# Patient Record
Sex: Male | Born: 1990 | Race: White | Hispanic: No | Marital: Single | Smoking: Current every day smoker
Health system: Southern US, Community
[De-identification: ages and names within clinical notes are randomized; demographics above are authoritative.]

## PROBLEM LIST (undated history)

## (undated) HISTORY — PX: OTHER SURGICAL HISTORY: SHX169

---
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
## 2011-05-26 ENCOUNTER — Emergency Department: Payer: Self-pay | Admitting: Emergency Medicine

## 2011-06-14 ENCOUNTER — Emergency Department: Payer: Self-pay | Admitting: Emergency Medicine

## 2013-03-06 IMAGING — CR DG KNEE COMPLETE 4+V*R*
1 series · 4 of 4 positions shown · non-contrast
Comparison: none

REASON FOR EXAM: trauma, pain, swelling, fall
COMMENTS:   LMP: (Male)

PROCEDURE:     DXR - DXR KNEE RT COMP WITH OBLIQUES  - June 14, 2011 [DATE]
RESULT:     Images of the left knee demonstrate no fracture, dislocation or
radiopaque foreign body.

[Series 1: t knee ap right · 0.14mm/px · 4 of 4 slices shown]
[im 1/4]
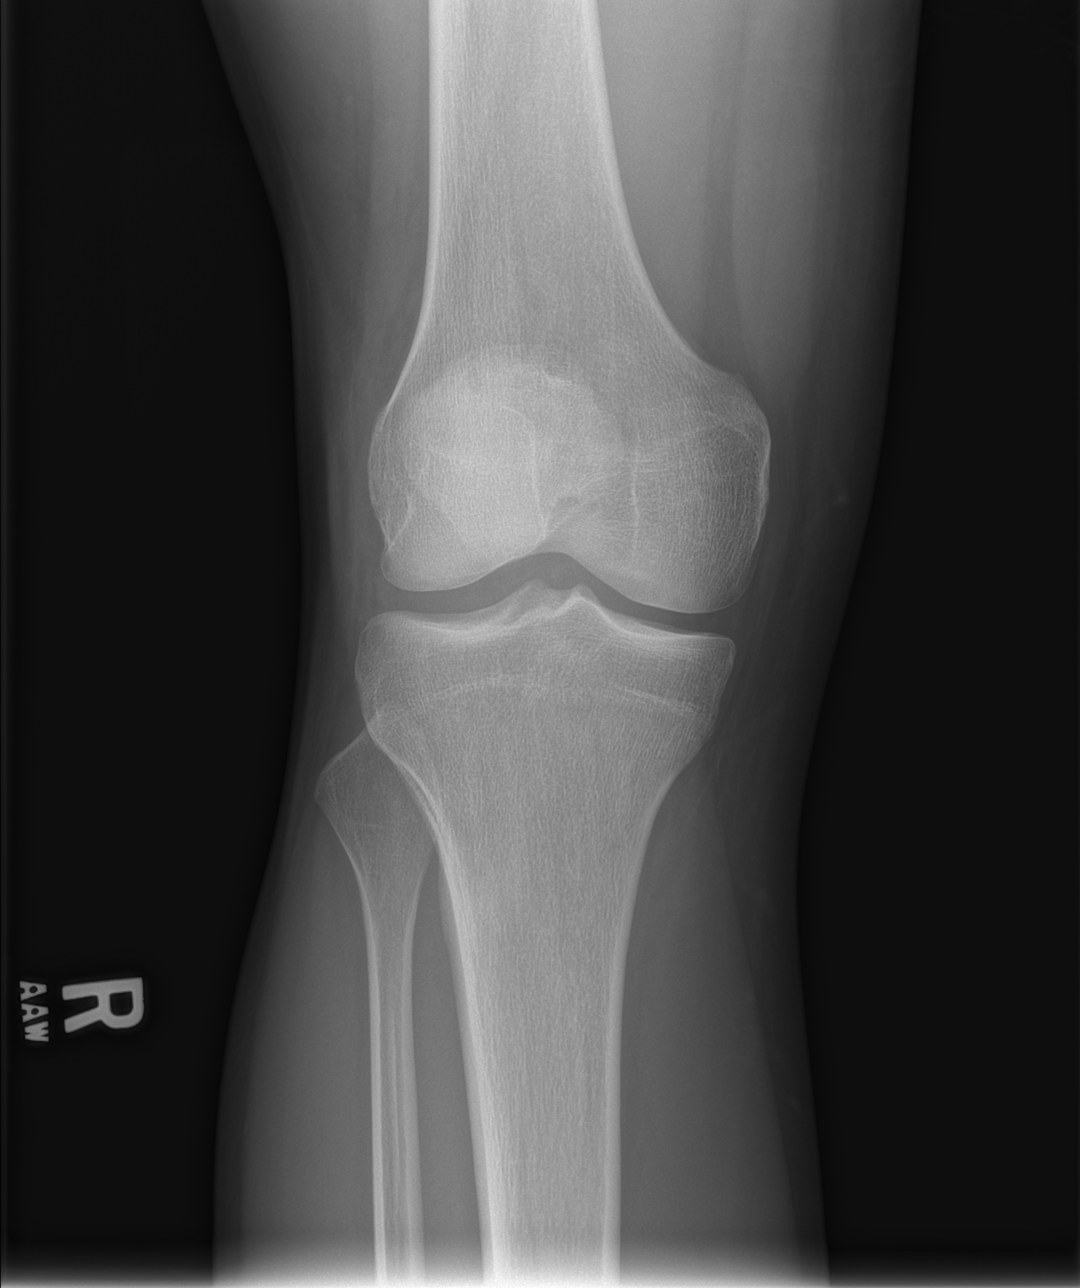
[im 2/4]
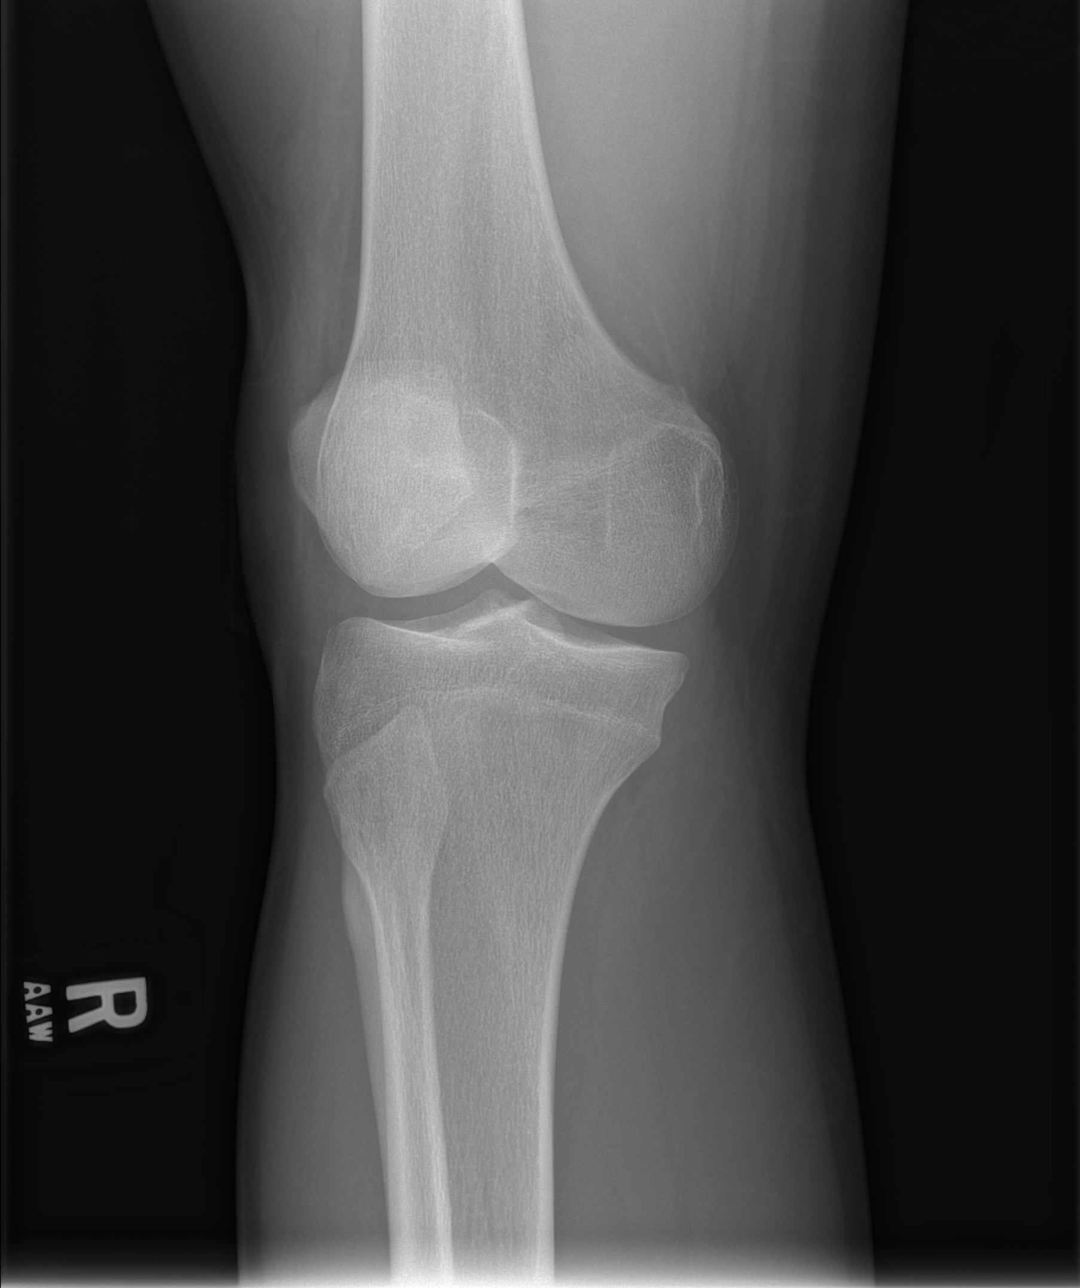
[im 3/4]
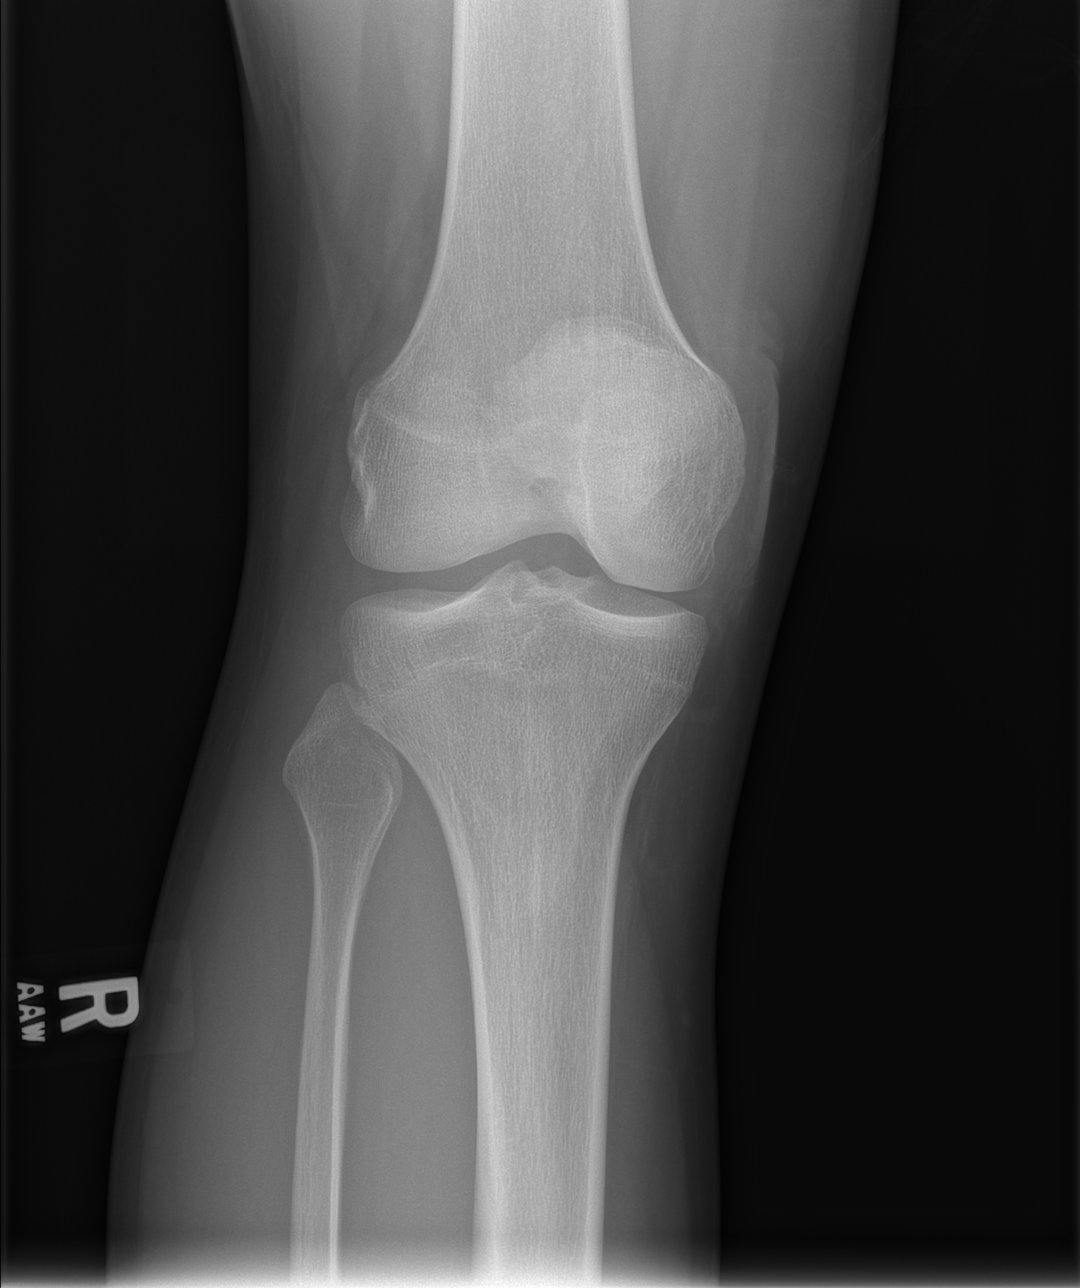
[im 4/4]
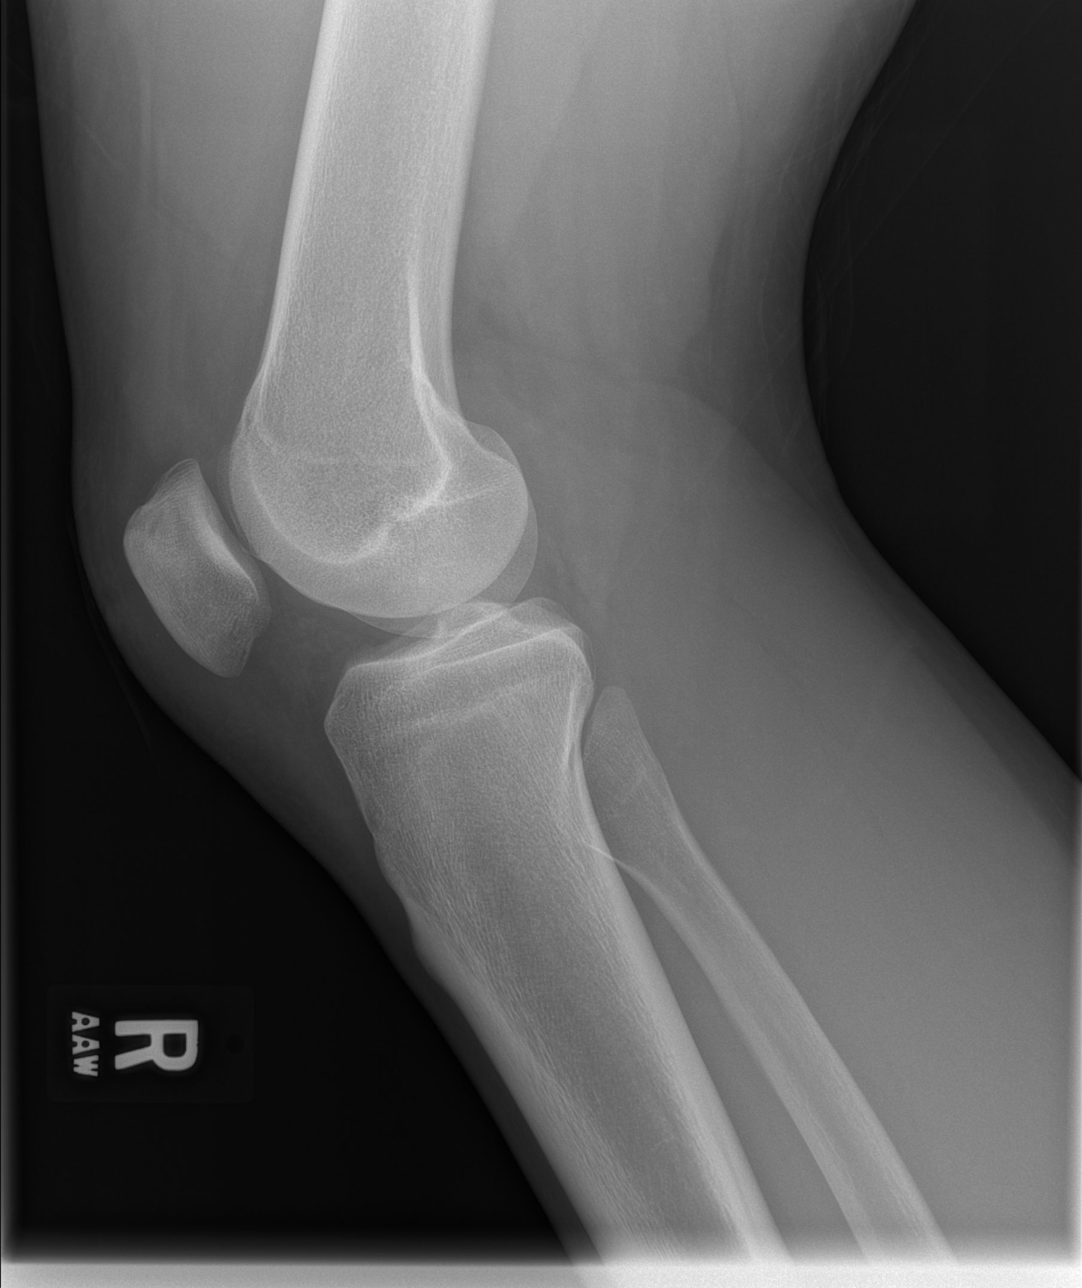

[4 of 4 positions shown; findings below may reference images not displayed]

IMPRESSION: No acute bony abnormality evident.

## 2021-12-29 ENCOUNTER — Ambulatory Visit
Admission: EM | Admit: 2021-12-29 | Discharge: 2021-12-29 | Disposition: A | Discharge status: Home / Self Care | Payer: Self-pay | Attending: Emergency Medicine | Admitting: Emergency Medicine

## 2021-12-29 ENCOUNTER — Encounter: Payer: Self-pay | Admitting: Emergency Medicine

## 2021-12-29 ENCOUNTER — Ambulatory Visit: Payer: Self-pay

## 2021-12-29 DIAGNOSIS — R369 Urethral discharge, unspecified: Secondary | ICD-10-CM | POA: Insufficient documentation

## 2021-12-29 MED ORDER — CEFTRIAXONE SODIUM 500 MG IJ SOLR
500.0000 mg | Freq: Once | INTRAMUSCULAR | Status: AC
  Administered 2021-12-29: 500 mg via INTRAMUSCULAR

## 2021-12-29 NOTE — ED Provider Notes (Signed)
Jeremiah Peterson    CSN: [id number] Arrival date & time: 12/29/21  1622      History   Chief Complaint Chief Complaint  Patient presents with   APPT 1700   Penile Discharge    HPI Jeremiah Peterson is a 31 y.o. male.   Patient presents with green, yellow penile discharge for 2 days, penile head swelling, dysuria and urinary frequency for 3 days. Sexually active, 1 male partner, no condom, use, no known exposure.  Denies hematuria, new rash or lesions,, fever, chills, testicle swelling.  History of syphilis.  History reviewed. No pertinent past medical history.  There are no problems to display for this patient.   Past Surgical History:  Procedure Laterality Date   arm fracture  Left        Home Medications    Prior to Admission medications   Not on File    Family History History reviewed. No pertinent family history.  Social History Social History   Tobacco Use   Smoking status: Every Day    Packs/day: 1.00    Years: 20.00    Total pack years: 20.00    Types: Cigarettes   Smokeless tobacco: Never  Vaping Use   Vaping Use: Every day   Substances: Nicotine  Substance Use Topics   Alcohol use: Not Currently   Drug use: Yes    Types: Marijuana     Allergies   Patient has no known allergies.   Review of Systems Review of Systems  Genitourinary:  Positive for penile discharge and penile swelling. Negative for decreased urine volume, difficulty urinating, dysuria, enuresis, flank pain, frequency, genital sores, hematuria, penile pain, scrotal swelling, testicular pain and urgency.     Physical Exam Triage Vital Signs ED Triage Vitals  Enc Vitals Group     BP 12/29/21 1705 133/78     Pulse Rate 12/29/21 1705 (!) 110     Resp 12/29/21 1705 16     Temp 12/29/21 1705 97.9 F (36.6 C)     Temp Source 12/29/21 1705 Oral     SpO2 12/29/21 1705 98 %     Weight 12/29/21 1708 179 lb (81.2 kg)     Height 12/29/21 1708 5\' 11"  (1.803 m)     Head  Circumference --      Peak Flow --      Pain Score 12/29/21 1708 5     Pain Loc --      Pain Edu? --      Excl. in GC? --    No data found.  Updated Vital Signs BP 133/78 (BP Location: Left Arm)   Pulse (!) 110   Temp 97.9 F (36.6 C) (Oral)   Resp 16   Ht 5\' 11"  (1.803 m)   Wt 179 lb (81.2 kg)   SpO2 98%   BMI 24.97 kg/m   Visual Acuity Right Eye Distance:   Left Eye Distance:   Bilateral Distance:    Right Eye Near:   Left Eye Near:    Bilateral Near:     Physical Exam Constitutional:      Appearance: Normal appearance.  HENT:     Head: Normocephalic.  Eyes:     Extraocular Movements: Extraocular movements intact.  Pulmonary:     Effort: Pulmonary effort is normal.  Genitourinary:    Comments: Moderate penile shaft swelling with erythema surrounding the penile head Neurological:     Mental Status: He is alert and oriented to person, place, and time.  Mental status is at baseline.  Psychiatric:        Mood and Affect: Mood normal.        Behavior: Behavior normal.      UC Treatments / Results  Labs (all labs ordered are listed, but only abnormal results are displayed) Labs Reviewed  CYTOLOGY, (ORAL, ANAL, URETHRAL) ANCILLARY ONLY    EKG   Radiology No results found.  Procedures Procedures (including critical care time)  Medications Ordered in UC Medications  cefTRIAXone (ROCEPHIN) injection 500 mg (has no administration in time range)    Initial Impression / Assessment and Plan / UC Course  I have reviewed the triage vital signs and the nursing notes.  Pertinent labs & imaging results that were available during my care of the patient were reviewed by me and considered in my medical decision making (see chart for details).   Penile discharge  We will prophylactically cover for gonorrhea, Rocephin injection given in office, advised abstinence for 7 days, remaining STI labs pending, will treat further per protocol, advised abstinence until  lab results, treatment is complete and symptoms have resolved, may follow-up with urgent care as needed   Final Clinical Impressions(s) / UC Diagnoses   Final diagnoses:  Penile discharge     Discharge Instructions      Today you are being treated prophylactically for gonorrhea, given an injection of Rocephin having sex for 7 days   Labs pending ,you will be contacted if positive for any sti and treatment will be sent to the pharmacy, you will have to return to the clinic if positive for gonorrhea to receive treatment   Please refrain from having sex until labs results, if positive please refrain from having sex until treatment complete and symptoms resolve   If positive for  Chlamydia  gonorrhea or trichomoniasis please notify partner or partners so they may tested as well  Moving forward, it is recommended you use some form of protection against the transmission of sti infections  such as condoms or dental dams with each sexual encounter     ED Prescriptions   None    PDMP not reviewed this encounter.   Valinda Hoar, NP 12/29/21 1728

## 2021-12-29 NOTE — ED Triage Notes (Signed)
Patient c/o penile discharge x 3 days ago.   Patient denies testicular pain.   Patient endorses "green to yellow" discharge.   Patient endorses penile pain on " the penile shaft".   Patient has taken Bactrim (2 pills first day, and one a day for the past 2 days) with no relief of symptoms.

## 2021-12-29 NOTE — Discharge Instructions (Signed)
Today you are being treated prophylactically for gonorrhea, given an injection of Rocephin having sex for 7 days   Labs pending ,you will be contacted if positive for any sti and treatment will be sent to the pharmacy, you will have to return to the clinic if positive for gonorrhea to receive treatment   Please refrain from having sex until labs results, if positive please refrain from having sex until treatment complete and symptoms resolve   If positive for  Chlamydia  gonorrhea or trichomoniasis please notify partner or partners so they may tested as well  Moving forward, it is recommended you use some form of protection against the transmission of sti infections  such as condoms or dental dams with each sexual encounter

## 2022-01-02 LAB — CYTOLOGY, (ORAL, ANAL, URETHRAL) ANCILLARY ONLY
Chlamydia: NEGATIVE
Comment: NEGATIVE
Comment: NEGATIVE
Comment: NORMAL
Neisseria Gonorrhea: POSITIVE — AB
Trichomonas: NEGATIVE

## 2022-10-01 ENCOUNTER — Ambulatory Visit (INDEPENDENT_AMBULATORY_CARE_PROVIDER_SITE_OTHER): Payer: Self-pay

## 2022-10-01 ENCOUNTER — Encounter: Payer: Self-pay | Admitting: Emergency Medicine

## 2022-10-01 ENCOUNTER — Ambulatory Visit
Admission: EM | Admit: 2022-10-01 | Discharge: 2022-10-01 | Disposition: A | Discharge status: Home / Self Care | Payer: Self-pay | Attending: Physician Assistant | Admitting: Physician Assistant

## 2022-10-01 DIAGNOSIS — M79641 Pain in right hand: Secondary | ICD-10-CM

## 2022-10-01 DIAGNOSIS — S60221A Contusion of right hand, initial encounter: Secondary | ICD-10-CM

## 2022-10-01 NOTE — ED Triage Notes (Signed)
Pt was at home and dropped a sledge hammer onto his right hand.

## 2022-10-01 NOTE — ED Provider Notes (Signed)
[facility]    CSN: [id number] Arrival date & time: 10/01/22  1426      History   Chief Complaint Chief Complaint  Patient presents with   Hand Pain    HPI Jeremiah Peterson is a 32 y.o. male presenting for right hand pain, swelling and bruising.  Patient says that he punched a wall yesterday at home.  He says he just tried to sleep it off.  Denies applying any ice or taking anything for pain relief.  He is right-handed.  He reports increased pain when he tries to make a fist.  He says he could not go to work today and does not think he can go to work tomorrow either because of the pain.  No numbness or weakness.  No other complaints.  HPI  History reviewed. No pertinent past medical history.  There are no problems to display for this patient.   Past Surgical History:  Procedure Laterality Date   arm fracture  Left        Home Medications    Prior to Admission medications   Not on File    Family History History reviewed. No pertinent family history.  Social History Social History   Tobacco Use   Smoking status: Every Day    Packs/day: 1.00    Years: 20.00    Additional pack years: 0.00    Total pack years: 20.00    Types: Cigarettes   Smokeless tobacco: Never  Vaping Use   Vaping Use: Every day   Substances: Nicotine  Substance Use Topics   Alcohol use: Not Currently   Drug use: Yes    Types: Marijuana     Allergies   Patient has no known allergies.   Review of Systems Review of Systems  Musculoskeletal:  Positive for arthralgias and joint swelling.  Skin:  Positive for color change. Negative for wound.  Neurological:  Negative for weakness and numbness.     Physical Exam Triage Vital Signs ED Triage Vitals  Enc Vitals Group     BP 10/01/22 1452 127/83     Pulse Rate 10/01/22 1452 100     Resp 10/01/22 1452 18     Temp 10/01/22 1452 98.6 F (37 C)     Temp Source 10/01/22 1452 Oral     SpO2 10/01/22 1452 100 %     Weight  --      Height --      Head Circumference --      Peak Flow --      Pain Score 10/01/22 1451 10     Pain Loc --      Pain Edu? --      Excl. in GC? --    No data found.  Updated Vital Signs BP 127/83 (BP Location: Left Arm)   Pulse 100   Temp 98.6 F (37 C) (Oral)   Resp 18   SpO2 100%      Physical Exam Vitals and nursing note reviewed.  Constitutional:      General: He is not in acute distress.    Appearance: Normal appearance. He is well-developed.  HENT:     Head: Normocephalic and atraumatic.  Eyes:     General: No scleral icterus.    Conjunctiva/sclera: Conjunctivae normal.  Cardiovascular:     Pulses: Normal pulses.  Pulmonary:     Effort: Pulmonary effort is normal. No respiratory distress.  Musculoskeletal:     Right hand: Swelling (Moderate swelling 3rd MCP) and  tenderness (3rd MCP) present. Normal range of motion. Normal capillary refill. Normal pulse.     Cervical back: Neck supple.     Comments: Swelling and contusions of MCPs 1,2, 3, and 4  Skin:    General: Skin is warm and dry.     Capillary Refill: Capillary refill takes less than 2 seconds.  Neurological:     General: No focal deficit present.     Mental Status: He is alert. Mental status is at baseline.     Motor: No weakness.     Gait: Gait normal.  Psychiatric:        Mood and Affect: Mood normal.        Behavior: Behavior normal.      UC Treatments / Results  Labs (all labs ordered are listed, but only abnormal results are displayed) Labs Reviewed - No data to display  EKG   Radiology DG Hand Complete Right  Result Date: 10/01/2022 CLINICAL DATA:  Blunt trauma to EXAM: RIGHT HAND - COMPLETE 3+ VIEW COMPARISON:  None Available. FINDINGS: No evidence of fracture of the carpal or metacarpal bones. Radiocarpal joint is intact. Phalanges are normal. No soft tissue injury. IMPRESSION: No fracture or dislocation. Electronically Signed   By: Genevive Bi M.D.   On: 10/01/2022 15:08     Procedures Procedures (including critical care time)  Medications Ordered in UC Medications - No data to display  Initial Impression / Assessment and Plan / UC Course  I have reviewed the triage vital signs and the nursing notes.  Pertinent labs & imaging results that were available during my care of the patient were reviewed by me and considered in my medical decision making (see chart for details).   32 year old male presents for right hand pain, swelling and bruising after punching a wall yesterday.  X-ray performed today shows no acute abnormality.  I independently reviewed the images myself.  X-ray is normal.  Discussed with patient.  Hand contusion.  Applied Ace wrap.  Reviewed RICE guidelines.  Advised ibuprofen or Aleve for pain.  Explained he can also take Tylenol.  Discussed the importance of ice.  Reviewed return precautions.   Final Clinical Impressions(s) / UC Diagnoses   Final diagnoses:  Contusion of right hand, initial encounter  Right hand pain     Discharge Instructions      HAND PAIN: No fractures. Stressed avoiding painful activities . Reviewed RICE guidelines. Use medications as directed, including NSAIDs. If no NSAIDs have been prescribed for you today, you may take Aleve or Motrin over the counter. May use Tylenol in between doses of NSAIDs.  If no improvement in the next 1-2 weeks, f/u with PCP or return to our office for reexamination, and please feel free to call or return at any time for any questions or concerns you may have and we will be happy to help you!         ED Prescriptions   None    PDMP not reviewed this encounter.   Shirlee Latch, PA-C 10/01/22 [phone]

## 2022-10-01 NOTE — Discharge Instructions (Addendum)
HAND PAIN: No fractures! Stressed avoiding painful activities . Reviewed RICE guidelines. Use medications as directed, including NSAIDs. If no NSAIDs have been prescribed for you today, you may take Aleve or Motrin over the counter. May use Tylenol in between doses of NSAIDs.  If no improvement in the next 1-2 weeks, f/u with PCP or return to our office for reexamination, and please feel free to call or return at any time for any questions or concerns you may have and we will be happy to help you!
